# Patient Record
Sex: Female | Born: 1980 | Race: Black or African American | Hispanic: No | Marital: Single | Smoking: Current every day smoker
Health system: Southern US, Community
[De-identification: ages and names within clinical notes are randomized; demographics above are authoritative.]

## PROBLEM LIST (undated history)

## (undated) DIAGNOSIS — R42 Dizziness and giddiness: Secondary | ICD-10-CM

## (undated) HISTORY — PX: NO PAST SURGERIES: SHX2092

---
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
## 2011-06-14 ENCOUNTER — Emergency Department: Payer: Self-pay | Admitting: Unknown Physician Specialty

## 2013-08-22 ENCOUNTER — Encounter (HOSPITAL_COMMUNITY): Payer: Self-pay | Admitting: Emergency Medicine

## 2013-08-22 ENCOUNTER — Emergency Department (HOSPITAL_COMMUNITY)
Admission: EM | Admit: 2013-08-22 | Discharge: 2013-08-22 | Discharge status: Against Medical Advice | Payer: Medicaid Other | Attending: Emergency Medicine | Admitting: Emergency Medicine

## 2013-08-22 ENCOUNTER — Emergency Department (HOSPITAL_COMMUNITY)
Admission: EM | Admit: 2013-08-22 | Discharge: 2013-08-22 | Disposition: A | Discharge status: Home / Self Care | Payer: Medicaid Other | Source: Home / Self Care

## 2013-08-22 DIAGNOSIS — R300 Dysuria: Secondary | ICD-10-CM | POA: Insufficient documentation

## 2013-08-22 DIAGNOSIS — F17200 Nicotine dependence, unspecified, uncomplicated: Secondary | ICD-10-CM | POA: Insufficient documentation

## 2013-08-22 DIAGNOSIS — R109 Unspecified abdominal pain: Secondary | ICD-10-CM | POA: Insufficient documentation

## 2013-08-22 NOTE — ED Notes (Signed)
Called to room no answer x1

## 2013-08-22 NOTE — ED Notes (Signed)
Pt here for abd pain and painful urination , sts had sex Saturday night and symptoms started after that.

## 2013-08-22 NOTE — ED Notes (Signed)
Called for room x2 no answer. 

## 2013-08-22 NOTE — ED Notes (Signed)
Called to reassess vital signs - no answer.

## 2018-01-19 DIAGNOSIS — L739 Follicular disorder, unspecified: Secondary | ICD-10-CM | POA: Diagnosis not present

## 2018-02-25 DIAGNOSIS — L731 Pseudofolliculitis barbae: Secondary | ICD-10-CM | POA: Diagnosis not present

## 2018-02-25 DIAGNOSIS — R102 Pelvic and perineal pain: Secondary | ICD-10-CM | POA: Diagnosis not present

## 2018-02-25 DIAGNOSIS — R309 Painful micturition, unspecified: Secondary | ICD-10-CM | POA: Diagnosis not present

## 2018-03-10 DIAGNOSIS — L218 Other seborrheic dermatitis: Secondary | ICD-10-CM | POA: Diagnosis not present

## 2018-03-10 DIAGNOSIS — L732 Hidradenitis suppurativa: Secondary | ICD-10-CM | POA: Diagnosis not present

## 2018-03-10 DIAGNOSIS — Z79899 Other long term (current) drug therapy: Secondary | ICD-10-CM | POA: Diagnosis not present

## 2018-08-08 DIAGNOSIS — R42 Dizziness and giddiness: Secondary | ICD-10-CM

## 2018-08-08 HISTORY — DX: Dizziness and giddiness: R42

## 2018-10-11 DIAGNOSIS — J019 Acute sinusitis, unspecified: Secondary | ICD-10-CM | POA: Diagnosis not present

## 2018-10-11 DIAGNOSIS — B9689 Other specified bacterial agents as the cause of diseases classified elsewhere: Secondary | ICD-10-CM | POA: Diagnosis not present

## 2018-10-22 DIAGNOSIS — J343 Hypertrophy of nasal turbinates: Secondary | ICD-10-CM | POA: Diagnosis not present

## 2018-10-22 DIAGNOSIS — R430 Anosmia: Secondary | ICD-10-CM | POA: Diagnosis not present

## 2018-10-22 DIAGNOSIS — J3489 Other specified disorders of nose and nasal sinuses: Secondary | ICD-10-CM | POA: Diagnosis not present

## 2018-10-22 DIAGNOSIS — J342 Deviated nasal septum: Secondary | ICD-10-CM | POA: Diagnosis not present

## 2018-10-25 DIAGNOSIS — R42 Dizziness and giddiness: Secondary | ICD-10-CM | POA: Diagnosis not present

## 2018-10-25 DIAGNOSIS — Z23 Encounter for immunization: Secondary | ICD-10-CM | POA: Diagnosis not present

## 2018-10-25 DIAGNOSIS — R7303 Prediabetes: Secondary | ICD-10-CM | POA: Diagnosis not present

## 2018-10-29 DIAGNOSIS — J3489 Other specified disorders of nose and nasal sinuses: Secondary | ICD-10-CM | POA: Diagnosis not present

## 2018-11-02 DIAGNOSIS — J342 Deviated nasal septum: Secondary | ICD-10-CM | POA: Diagnosis not present

## 2018-11-02 DIAGNOSIS — J343 Hypertrophy of nasal turbinates: Secondary | ICD-10-CM | POA: Diagnosis not present

## 2018-11-02 DIAGNOSIS — J3489 Other specified disorders of nose and nasal sinuses: Secondary | ICD-10-CM | POA: Diagnosis not present

## 2018-12-31 DIAGNOSIS — R079 Chest pain, unspecified: Secondary | ICD-10-CM | POA: Diagnosis not present

## 2019-02-23 DIAGNOSIS — R3915 Urgency of urination: Secondary | ICD-10-CM | POA: Diagnosis not present

## 2019-02-23 DIAGNOSIS — R109 Unspecified abdominal pain: Secondary | ICD-10-CM | POA: Diagnosis not present

## 2019-03-29 DIAGNOSIS — J301 Allergic rhinitis due to pollen: Secondary | ICD-10-CM | POA: Diagnosis not present

## 2019-03-29 DIAGNOSIS — J342 Deviated nasal septum: Secondary | ICD-10-CM | POA: Diagnosis not present

## 2019-03-30 ENCOUNTER — Encounter: Payer: Self-pay | Admitting: *Deleted

## 2019-03-31 ENCOUNTER — Other Ambulatory Visit: Payer: Self-pay

## 2019-03-31 ENCOUNTER — Encounter: Payer: Self-pay | Admitting: *Deleted

## 2019-03-31 ENCOUNTER — Encounter: Payer: Self-pay | Admitting: Anesthesiology

## 2019-04-04 ENCOUNTER — Other Ambulatory Visit: Admission: RE | Admit: 2019-04-04 | Payer: Medicaid Other | Source: Ambulatory Visit

## 2019-04-07 NOTE — Discharge Instructions (Signed)
[facility] °[facility] °ENDOSCOPIC SINUS SURGERY °[facility] ° °What is Functional Endoscopic Sinus Surgery? ° The Surgery involves making the natural openings of the sinuses larger by removing the bony partitions that separate the sinuses from the nasal cavity.  The natural sinus lining is preserved as much as possible to allow the sinuses to resume normal function after the surgery.  In some patients nasal polyps (excessively swollen lining of the sinuses) may be removed to relieve obstruction of the sinus openings.  The surgery is performed through the nose using lighted scopes, which eliminates the need for incisions on the face.  A septoplasty is a different procedure which is sometimes performed with sinus surgery.  It involves straightening the boy partition that separates the two sides of your nose.  A crooked or deviated septum may need repair if is obstructing the sinuses or nasal airflow.  Turbinate reduction is also often performed during sinus surgery.  The turbinates are bony proturberances from the side walls of the nose which swell and can obstruct the nose in patients with sinus and allergy problems.  Their size can be surgically reduced to help relieve nasal obstruction. ° °What Can Sinus Surgery Do For Me? ° Sinus surgery can reduce the frequency of sinus infections requiring antibiotic treatment.  This can provide improvement in nasal congestion, post-nasal drainage, facial pressure and nasal obstruction.  Surgery will NOT prevent you from ever having an infection again, so it usually only for patients who get infections 4 or more times yearly requiring antibiotics, or for infections that do not clear with antibiotics.  It will not cure nasal allergies, so patients with allergies may still require medication to treat their allergies after surgery. Surgery may improve headaches related to sinusitis, however, some people will continue to  require medication to control sinus headaches related to allergies.  Surgery will do nothing for other forms of headache (migraine, tension or cluster). ° °What Are the Risks of Endoscopic Sinus Surgery? ° Current techniques allow surgery to be performed safely with little risk, however, there are rare complications that patients should be aware of.  Because the sinuses are located around the eyes, there is risk of eye injury, including blindness, though again, this would be quite rare. This is usually a result of bleeding behind the eye during surgery, which puts the vision oat risk, though there are treatments to protect the vision and prevent permanent disrupted by surgery causing a leak of the spinal fluid that surrounds the brain.  More serious complications would include bleeding inside the brain cavity or damage to the brain.  Again, all of these complications are uncommon, and spinal fluid leaks can be safely managed surgically if they occur.  The most common complication of sinus surgery is bleeding from the nose, which may require packing or cauterization of the nose.  Continued sinus have polyps may experience recurrence of the polyps requiring revision surgery.  Alterations of sense of smell or injury to the tear ducts are also rare complications.  ° °What is the Surgery Like, and what is the Recovery? ° The Surgery usually takes a couple of hours to perform, and is usually performed under a general anesthetic (completely asleep).  Patients are usually discharged home after a couple of hours.  Sometimes during surgery it is necessary to pack the nose to control bleeding, and the packing is left in place for 24 - 48 hours, and removed by your surgeon.    If a septoplasty was performed during the procedure, there is often a splint placed which must be removed after 5-7 days.   °Discomfort: Pain is usually mild to moderate, and can be controlled by prescription pain medication or acetaminophen (Tylenol).   Aspirin, Ibuprofen (Advil, Motrin), or Naprosyn (Aleve) should be avoided, as they can cause increased bleeding.  Most patients feel sinus pressure like they have a bad head cold for several days.  Sleeping with your head elevated can help reduce swelling and facial pressure, as can ice packs over the face.  A humidifier may be helpful to keep the mucous and blood from drying in the nose.  ° °Diet: There are no specific diet restrictions, however, you should generally start with clear liquids and a light diet of bland foods because the anesthetic can cause some nausea.  Advance your diet depending on how your stomach feels.  Taking your pain medication with food will often help reduce stomach upset which pain medications can cause. ° °Nasal Saline Irrigation: It is important to remove blood clots and dried mucous from the nose as it is healing.  This is done by having you irrigate the nose at least 3 - 4 times daily with a salt water solution.  We recommend using NeilMed Sinus Rinse (available at the drug store).  Fill the squeeze bottle with the solution, bend over a sink, and insert the tip of the squeeze bottle into the nose ½ of an inch.  Point the tip of the squeeze bottle towards the inside corner of the eye on the same side your irrigating.  Squeeze the bottle and gently irrigate the nose.  If you bend forward as you do this, most of the fluid will flow back out of the nose, instead of down your throat.   The solution should be warm, near body temperature, when you irrigate.   Each time you irrigate, you should use a full squeeze bottle.  ° °Note that if you are instructed to use Nasal Steroid Sprays at any time after your surgery, irrigate with saline BEFORE using the steroid spray, so you do not wash it all out of the nose. °Another product, Nasal Saline Gel (such as AYR Nasal Saline Gel) can be applied in each nostril 3 - 4 times daily to moisture the nose and reduce scabbing or crusting. ° °Bleeding:   Bloody drainage from the nose can be expected for several days, and patients are instructed to irrigate their nose frequently with salt water to help remove mucous and blood clots.  The drainage may be dark red or brown, though some fresh blood may be seen intermittently, especially after irrigation.  Do not blow you nose, as bleeding may occur. If you must sneeze, keep your mouth open to allow air to escape through your mouth. ° °If heavy bleeding occurs: Irrigate the nose with saline to rinse out clots, then spray the nose 3 - 4 times with Afrin Nasal Decongestant Spray.  The spray will constrict the blood vessels to slow bleeding.  Pinch the lower half of your nose shut to apply pressure, and lay down with your head elevated.  Ice packs over the nose may help as well. If bleeding persists despite these measures, you should notify your doctor.  Do not use the Afrin routinely to control nasal congestion after surgery, as it can result in worsening congestion and may affect healing.  ° ° ° °Activity: Return to work varies among patients. Most patients will be   out of work at least 5 - 7 days to recover.  Patient may return to work after they are off of narcotic pain medication, and feeling well enough to perform the functions of their job.  Patients must avoid heavy lifting (over 10 pounds) or strenuous physical for 2 weeks after surgery, so your employer may need to assign you to light duty, or keep you out of work longer if light duty is not possible.  NOTE: you should not drive, operate dangerous machinery, do any mentally demanding tasks or make any important legal or financial decisions while on narcotic pain medication and recovering from the general anesthetic.  °  °Call Your Doctor Immediately if You Have Any of the Following: °1. Bleeding that you cannot control with the above measures °2. Loss of vision, double vision, bulging of the eye or black eyes. °3. Fever over 101 degrees °4. Neck stiffness with  severe headache, fever, nausea and change in mental state. °You are always encourage to call anytime with concerns, however, please call with requests for pain medication refills during office hours. ° °Office Endoscopy: During follow-up visits your doctor will remove any packing or splints that may have been placed and evaluate and clean your sinuses endoscopically.  Topical anesthetic will be used to make this as comfortable as possible, though you may want to take your pain medication prior to the visit.  How often this will need to be done varies from patient to patient.  After complete recovery from the surgery, you may need follow-up endoscopy from time to time, particularly if there is concern of recurrent infection or nasal polyps. ° ° °General Anesthesia, Adult, Care After °This sheet gives you information about how to care for yourself after your procedure. Your health care provider may also give you more specific instructions. If you have problems or questions, contact your health care provider. °What can I expect after the procedure? °After the procedure, the following side effects are common: °· Pain or discomfort at the IV site. °· Nausea. °· Vomiting. °· Sore throat. °· Trouble concentrating. °· Feeling cold or chills. °· Weak or tired. °· Sleepiness and fatigue. °· Soreness and body aches. These side effects can affect parts of the body that were not involved in surgery. °Follow these instructions at home: ° °For at least 24 hours after the procedure: °· Have a responsible adult stay with you. It is important to have someone help care for you until you are awake and alert. °· Rest as needed. °· Do not: °? Participate in activities in which you could fall or become injured. °? Drive. °? Use heavy machinery. °? Drink alcohol. °? Take sleeping pills or medicines that cause drowsiness. °? Make important decisions or sign legal documents. °? Take care of children on your own. °Eating and  drinking °· Follow any instructions from your health care provider about eating or drinking restrictions. °· When you feel hungry, start by eating small amounts of foods that are soft and easy to digest (bland), such as toast. Gradually return to your regular diet. °· Drink enough fluid to keep your urine pale yellow. °· If you vomit, rehydrate by drinking water, juice, or clear broth. °General instructions °· If you have sleep apnea, surgery and certain medicines can increase your risk for breathing problems. Follow instructions from your health care provider about wearing your sleep device: °? Anytime you are sleeping, including during daytime naps. °? While taking prescription pain medicines, sleeping medicines, or medicines   that make you drowsy. °· Return to your normal activities as told by your health care provider. Ask your health care provider what activities are safe for you. °· Take over-the-counter and prescription medicines only as told by your health care provider. °· If you smoke, do not smoke without supervision. °· Keep all follow-up visits as told by your health care provider. This is important. °Contact a health care provider if: °· You have nausea or vomiting that does not get better with medicine. °· You cannot eat or drink without vomiting. °· You have pain that does not get better with medicine. °· You are unable to pass urine. °· You develop a skin rash. °· You have a fever. °· You have redness around your IV site that gets worse. °Get help right away if: °· You have difficulty breathing. °· You have chest pain. °· You have blood in your urine or stool, or you vomit blood. °Summary °· After the procedure, it is common to have a sore throat or nausea. It is also common to feel tired. °· Have a responsible adult stay with you for the first 24 hours after general anesthesia. It is important to have someone help care for you until you are awake and alert. °· When you feel hungry, start by eating  small amounts of foods that are soft and easy to digest (bland), such as toast. Gradually return to your regular diet. °· Drink enough fluid to keep your urine pale yellow. °· Return to your normal activities as told by your health care provider. Ask your health care provider what activities are safe for you. °This information is not intended to replace advice given to you by your health care provider. Make sure you discuss any questions you have with your health care provider. °Document Released: 09/01/2000 Document Revised: 05/29/2017 Document Reviewed: 01/09/2017 °[employer] Patient Education © 2020 [employer] ° °

## 2019-04-11 ENCOUNTER — Inpatient Hospital Stay: Admission: RE | Admit: 2019-04-11 | Payer: Medicaid Other | Source: Ambulatory Visit

## 2019-04-11 DIAGNOSIS — R030 Elevated blood-pressure reading, without diagnosis of hypertension: Secondary | ICD-10-CM | POA: Diagnosis not present

## 2019-04-11 DIAGNOSIS — Z0000 Encounter for general adult medical examination without abnormal findings: Secondary | ICD-10-CM | POA: Diagnosis not present

## 2019-04-11 DIAGNOSIS — Z111 Encounter for screening for respiratory tuberculosis: Secondary | ICD-10-CM | POA: Diagnosis not present

## 2019-04-11 DIAGNOSIS — Z3200 Encounter for pregnancy test, result unknown: Secondary | ICD-10-CM | POA: Diagnosis not present

## 2019-04-14 ENCOUNTER — Ambulatory Visit: Admission: RE | Admit: 2019-04-14 | Payer: Medicaid Other | Source: Home / Self Care | Admitting: Otolaryngology

## 2019-04-14 SURGERY — SEPTOPLASTY, NOSE
Anesthesia: General | Laterality: Right

## 2019-04-16 DIAGNOSIS — Z113 Encounter for screening for infections with a predominantly sexual mode of transmission: Secondary | ICD-10-CM | POA: Diagnosis not present

## 2019-04-16 DIAGNOSIS — N898 Other specified noninflammatory disorders of vagina: Secondary | ICD-10-CM | POA: Diagnosis not present

## 2019-04-16 DIAGNOSIS — R1030 Lower abdominal pain, unspecified: Secondary | ICD-10-CM | POA: Diagnosis not present

## 2019-04-16 DIAGNOSIS — R1084 Generalized abdominal pain: Secondary | ICD-10-CM | POA: Diagnosis not present

## 2019-07-25 DIAGNOSIS — R350 Frequency of micturition: Secondary | ICD-10-CM | POA: Diagnosis not present

## 2019-07-25 DIAGNOSIS — N3941 Urge incontinence: Secondary | ICD-10-CM | POA: Diagnosis not present

## 2019-08-02 DIAGNOSIS — J3489 Other specified disorders of nose and nasal sinuses: Secondary | ICD-10-CM | POA: Diagnosis not present

## 2019-08-02 DIAGNOSIS — J342 Deviated nasal septum: Secondary | ICD-10-CM | POA: Diagnosis not present

## 2019-08-02 DIAGNOSIS — J343 Hypertrophy of nasal turbinates: Secondary | ICD-10-CM | POA: Diagnosis not present

## 2019-08-05 NOTE — Discharge Instructions (Signed)
[facility] ENDOSCOPIC [facility]  What is Functional Endoscopic Sinus Surgery?  The Surgery involves making the natural openings of the sinuses larger by removing the bony partitions that separate the sinuses from the nasal cavity.  The natural sinus lining is preserved as much as possible to allow the sinuses to resume normal function after the surgery.  In some patients nasal polyps (excessively swollen lining of the sinuses) may be removed to relieve obstruction of the sinus openings.  The surgery is performed through the nose using lighted scopes, which eliminates the need for incisions on the face.  A septoplasty is a different procedure which is sometimes performed with sinus surgery.  It involves straightening the boy partition that separates the two sides of your nose.  A crooked or deviated septum may need repair if is obstructing the sinuses or nasal airflow.  Turbinate reduction is also often performed during sinus surgery.  The turbinates are bony proturberances from the side walls of the nose which swell and can obstruct the nose in patients with sinus and allergy problems.  Their size can be surgically reduced to help relieve nasal obstruction.  What Can Sinus Surgery Do For Me?  Sinus surgery can reduce the frequency of sinus infections requiring antibiotic treatment.  This can provide improvement in nasal congestion, post-nasal drainage, facial pressure and nasal obstruction.  Surgery will NOT prevent you from ever having an infection again, so it usually only for patients who get infections 4 or more times yearly requiring antibiotics, or for infections that do not clear with antibiotics.  It will not cure nasal allergies, so patients with allergies may still require medication to treat their allergies after surgery. Surgery may improve headaches related to sinusitis, however, some people will continue to  require medication to control sinus headaches related to allergies.  Surgery will do nothing for other forms of headache (migraine, tension or cluster).  What Are the Risks of Endoscopic Sinus Surgery?  Current techniques allow surgery to be performed safely with little risk, however, there are rare complications that patients should be aware of.  Because the sinuses are located around the eyes, there is risk of eye injury, including blindness, though again, this would be quite rare. This is usually a result of bleeding behind the eye during surgery, which puts the vision oat risk, though there are treatments to protect the vision and prevent permanent disrupted by surgery causing a leak of the spinal fluid that surrounds the brain.  More serious complications would include bleeding inside the brain cavity or damage to the brain.  Again, all of these complications are uncommon, and spinal fluid leaks can be safely managed surgically if they occur.  The most common complication of sinus surgery is bleeding from the nose, which may require packing or cauterization of the nose.  Continued sinus have polyps may experience recurrence of the polyps requiring revision surgery.  Alterations of sense of smell or injury to the tear ducts are also rare complications.   What is the Surgery Like, and what is the Recovery?  The Surgery usually takes a couple of hours to perform, and is usually performed under a general anesthetic (completely asleep).  Patients are usually discharged home after a couple of hours.  Sometimes during surgery it is necessary to pack the nose to control bleeding, and the packing is left in place for 24 - 48 hours, and removed by your surgeon.    If a septoplasty was performed during the procedure, there is often a splint placed which must be removed after 5-7 days.   Discomfort: Pain is usually mild to moderate, and can be controlled by prescription pain medication or acetaminophen (Tylenol).   Aspirin, Ibuprofen (Advil, Motrin), or Naprosyn (Aleve) should be avoided, as they can cause increased bleeding.  Most patients feel sinus pressure like they have a bad head cold for several days.  Sleeping with your head elevated can help reduce swelling and facial pressure, as can ice packs over the face.  A humidifier may be helpful to keep the mucous and blood from drying in the nose.   Diet: There are no specific diet restrictions, however, you should generally start with clear liquids and a light diet of bland foods because the anesthetic can cause some nausea.  Advance your diet depending on how your stomach feels.  Taking your pain medication with food will often help reduce stomach upset which pain medications can cause.  Nasal Saline Irrigation: It is important to remove blood clots and dried mucous from the nose as it is healing.  This is done by having you irrigate the nose at least 3 - 4 times daily with a salt water solution.  We recommend using NeilMed Sinus Rinse (available at the drug store).  Fill the squeeze bottle with the solution, bend over a sink, and insert the tip of the squeeze bottle into the nose  of an inch.  Point the tip of the squeeze bottle towards the inside corner of the eye on the same side your irrigating.  Squeeze the bottle and gently irrigate the nose.  If you bend forward as you do this, most of the fluid will flow back out of the nose, instead of down your throat.   The solution should be warm, near body temperature, when you irrigate.   Each time you irrigate, you should use a full squeeze bottle.   Note that if you are instructed to use Nasal Steroid Sprays at any time after your surgery, irrigate with saline BEFORE using the steroid spray, so you do not wash it all out of the nose. Another product, Nasal Saline Gel (such as AYR Nasal Saline Gel) can be applied in each nostril 3 - 4 times daily to moisture the nose and reduce scabbing or crusting.  Bleeding:   Bloody drainage from the nose can be expected for several days, and patients are instructed to irrigate their nose frequently with salt water to help remove mucous and blood clots.  The drainage may be dark red or brown, though some fresh blood may be seen intermittently, especially after irrigation.  Do not blow you nose, as bleeding may occur. If you must sneeze, keep your mouth open to allow air to escape through your mouth.  If heavy bleeding occurs: Irrigate the nose with saline to rinse out clots, then spray the nose 3 - 4 times with Afrin Nasal Decongestant Spray.  The spray will constrict the blood vessels to slow bleeding.  Pinch the lower half of your nose shut to apply pressure, and lay down with your head elevated.  Ice packs over the nose may help as well. If bleeding persists despite these measures, you should notify your doctor.  Do not use the Afrin routinely to control nasal congestion after surgery, as it can result in worsening congestion and may affect healing.     Activity: Return to work varies among patients. Most patients will be   out of work at least 5 - 7 days to recover.  Patient may return to work after they are off of narcotic pain medication, and feeling well enough to perform the functions of their job.  Patients must avoid heavy lifting (over 10 pounds) or strenuous physical for 2 weeks after surgery, so your employer may need to assign you to light duty, or keep you out of work longer if light duty is not possible.  NOTE: you should not drive, operate dangerous machinery, do any mentally demanding tasks or make any important legal or financial decisions while on narcotic pain medication and recovering from the general anesthetic.    Call Your Doctor Immediately if You Have Any of the Following: 1. Bleeding that you cannot control with the above measures 2. Loss of vision, double vision, bulging of the eye or black eyes. 3. Fever over 101 degrees 4. Neck stiffness with  severe headache, fever, nausea and change in mental state. You are always encourage to call anytime with concerns, however, please call with requests for pain medication refills during office hours.  Office Endoscopy: During follow-up visits your doctor will remove any packing or splints that may have been placed and evaluate and clean your sinuses endoscopically.  Topical anesthetic will be used to make this as comfortable as possible, though you may want to take your pain medication prior to the visit.  How often this will need to be done varies from patient to patient.  After complete recovery from the surgery, you may need follow-up endoscopy from time to time, particularly if there is concern of recurrent infection or nasal polyps.  General Anesthesia, Adult, Care After This sheet gives you information about how to care for yourself after your procedure. Your health care provider may also give you more specific instructions. If you have problems or questions, contact your health care provider. What can I expect after the procedure? After the procedure, the following side effects are common:  Pain or discomfort at the IV site.  Nausea.  Vomiting.  Sore throat.  Trouble concentrating.  Feeling cold or chills.  Weak or tired.  Sleepiness and fatigue.  Soreness and body aches. These side effects can affect parts of the body that were not involved in surgery. Follow these instructions at home:  For at least 24 hours after the procedure:  Have a responsible adult stay with you. It is important to have someone help care for you until you are awake and alert.  Rest as needed.  Do not: ? Participate in activities in which you could fall or become injured. ? Drive. ? Use heavy machinery. ? Drink alcohol. ? Take sleeping pills or medicines that cause drowsiness. ? Make important decisions or sign legal documents. ? Take care of children on your own. Eating and drinking  Follow  any instructions from your health care provider about eating or drinking restrictions.  When you feel hungry, start by eating small amounts of foods that are soft and easy to digest (bland), such as toast. Gradually return to your regular diet.  Drink enough fluid to keep your urine pale yellow.  If you vomit, rehydrate by drinking water, juice, or clear broth. General instructions  If you have sleep apnea, surgery and certain medicines can increase your risk for breathing problems. Follow instructions from your health care provider about wearing your sleep device: ? Anytime you are sleeping, including during daytime naps. ? While taking prescription pain medicines, sleeping medicines, or medicines that   make you drowsy.  Return to your normal activities as told by your health care provider. Ask your health care provider what activities are safe for you.  Take over-the-counter and prescription medicines only as told by your health care provider.  If you smoke, do not smoke without supervision.  Keep all follow-up visits as told by your health care provider. This is important. Contact a health care provider if:  You have nausea or vomiting that does not get better with medicine.  You cannot eat or drink without vomiting.  You have pain that does not get better with medicine.  You are unable to pass urine.  You develop a skin rash.  You have a fever.  You have redness around your IV site that gets worse. Get help right away if:  You have difficulty breathing.  You have chest pain.  You have blood in your urine or stool, or you vomit blood. Summary  After the procedure, it is common to have a sore throat or nausea. It is also common to feel tired.  Have a responsible adult stay with you for the first 24 hours after general anesthesia. It is important to have someone help care for you until you are awake and alert.  When you feel hungry, start by eating small amounts of  foods that are soft and easy to digest (bland), such as toast. Gradually return to your regular diet.  Drink enough fluid to keep your urine pale yellow.  Return to your normal activities as told by your health care provider. Ask your health care provider what activities are safe for you. This information is not intended to replace advice given to you by your health care provider. Make sure you discuss any questions you have with your health care provider. Document Revised: 05/29/2017 Document Reviewed: 01/09/2017 Elsevier Patient Education  2020 [employer]  

## 2019-08-08 ENCOUNTER — Encounter: Payer: Self-pay | Admitting: Anesthesiology

## 2019-08-08 ENCOUNTER — Other Ambulatory Visit: Admission: RE | Admit: 2019-08-08 | Payer: Medicaid Other | Source: Ambulatory Visit

## 2019-08-08 NOTE — Anesthesia Preprocedure Evaluation (Deleted)
Anesthesia Evaluation    Airway        Dental   Pulmonary Current Smoker,           Cardiovascular      Neuro/Psych    GI/Hepatic   Endo/Other    Renal/GU      Musculoskeletal   Abdominal   Peds  Hematology   Anesthesia Other Findings   Reproductive/Obstetrics                             Anesthesia Physical Anesthesia Plan  ASA: II  Anesthesia Plan: General   Post-op Pain Management:    Induction: Intravenous  PONV Risk Score and Plan: 3 and Treatment may vary due to age or medical condition and Ondansetron  Airway Management Planned: Oral ETT  Additional Equipment:   Intra-op Plan:   Post-operative Plan: Extubation in OR  Informed Consent: I have reviewed the patients History and Physical, chart, labs and discussed the procedure including the risks, benefits and alternatives for the proposed anesthesia with the patient or authorized representative who has indicated his/her understanding and acceptance.       Plan Discussed with: CRNA and Anesthesiologist  Anesthesia Plan Comments:         Anesthesia Quick Evaluation

## 2019-08-10 ENCOUNTER — Encounter: Admission: RE | Payer: Self-pay | Source: Home / Self Care

## 2019-08-10 ENCOUNTER — Ambulatory Visit: Admission: RE | Admit: 2019-08-10 | Payer: Medicaid Other | Source: Home / Self Care | Admitting: Otolaryngology

## 2019-08-10 SURGERY — SEPTOPLASTY, NOSE
Anesthesia: General | Laterality: Right

## 2019-08-23 DIAGNOSIS — J342 Deviated nasal septum: Secondary | ICD-10-CM | POA: Diagnosis not present

## 2019-08-23 DIAGNOSIS — J343 Hypertrophy of nasal turbinates: Secondary | ICD-10-CM | POA: Diagnosis not present

## 2019-08-24 ENCOUNTER — Other Ambulatory Visit: Payer: Self-pay

## 2019-08-30 ENCOUNTER — Other Ambulatory Visit
Admission: RE | Admit: 2019-08-30 | Discharge: 2019-08-30 | Disposition: A | Discharge status: Home / Self Care | Payer: Medicaid Other | Source: Ambulatory Visit | Attending: Otolaryngology | Admitting: Otolaryngology

## 2019-08-30 ENCOUNTER — Other Ambulatory Visit: Payer: Self-pay

## 2019-08-30 DIAGNOSIS — Z01812 Encounter for preprocedural laboratory examination: Secondary | ICD-10-CM | POA: Diagnosis not present

## 2019-08-30 DIAGNOSIS — Z20822 Contact with and (suspected) exposure to covid-19: Secondary | ICD-10-CM | POA: Insufficient documentation

## 2019-08-30 LAB — SARS CORONAVIRUS 2 (TAT 6-24 HRS): SARS Coronavirus 2: NEGATIVE

## 2019-09-01 ENCOUNTER — Ambulatory Visit: Payer: Medicaid Other | Admitting: Anesthesiology

## 2019-09-01 ENCOUNTER — Encounter: Payer: Self-pay | Admitting: Otolaryngology

## 2019-09-01 ENCOUNTER — Encounter
Admission: RE | Disposition: A | Discharge status: Home / Self Care | Payer: Self-pay | Source: Home / Self Care | Attending: Otolaryngology

## 2019-09-01 ENCOUNTER — Other Ambulatory Visit: Payer: Self-pay

## 2019-09-01 ENCOUNTER — Ambulatory Visit
Admission: RE | Admit: 2019-09-01 | Discharge: 2019-09-01 | Disposition: A | Discharge status: Home / Self Care | Payer: Medicaid Other | Attending: Otolaryngology | Admitting: Otolaryngology

## 2019-09-01 DIAGNOSIS — J342 Deviated nasal septum: Secondary | ICD-10-CM | POA: Diagnosis not present

## 2019-09-01 DIAGNOSIS — J343 Hypertrophy of nasal turbinates: Secondary | ICD-10-CM | POA: Insufficient documentation

## 2019-09-01 DIAGNOSIS — J3489 Other specified disorders of nose and nasal sinuses: Secondary | ICD-10-CM | POA: Diagnosis not present

## 2019-09-01 HISTORY — PX: TURBINATE REDUCTION: SHX6157

## 2019-09-01 HISTORY — PX: ENDOSCOPIC CONCHA BULLOSA RESECTION: SHX6395

## 2019-09-01 HISTORY — PX: SEPTOPLASTY: SHX2393

## 2019-09-01 LAB — POCT PREGNANCY, URINE: Preg Test, Ur: NEGATIVE

## 2019-09-01 SURGERY — SEPTOPLASTY, NOSE
Anesthesia: General | Site: Nose | Laterality: Right

## 2019-09-01 MED ORDER — LIDOCAINE HCL (CARDIAC) PF 100 MG/5ML IV SOSY
PREFILLED_SYRINGE | INTRAVENOUS | Status: DC | PRN
  Administered 2019-09-01: 40 mg via INTRAVENOUS

## 2019-09-01 MED ORDER — DEXAMETHASONE SODIUM PHOSPHATE 4 MG/ML IJ SOLN
INTRAMUSCULAR | Status: DC | PRN
  Administered 2019-09-01: 10 mg via INTRAVENOUS

## 2019-09-01 MED ORDER — ACETAMINOPHEN 10 MG/ML IV SOLN
1000.0000 mg | Freq: Once | INTRAVENOUS | Status: AC
  Administered 2019-09-01: 1000 mg via INTRAVENOUS

## 2019-09-01 MED ORDER — PHENYLEPHRINE HCL 0.5 % NA SOLN
NASAL | Status: DC | PRN
  Administered 2019-09-01: 15 mL via TOPICAL

## 2019-09-01 MED ORDER — ONDANSETRON HCL 4 MG/2ML IJ SOLN
INTRAMUSCULAR | Status: DC | PRN
  Administered 2019-09-01: 4 mg via INTRAVENOUS

## 2019-09-01 MED ORDER — MIDAZOLAM HCL 5 MG/5ML IJ SOLN
INTRAMUSCULAR | Status: DC | PRN
  Administered 2019-09-01: 2 mg via INTRAVENOUS

## 2019-09-01 MED ORDER — PROPOFOL 10 MG/ML IV BOLUS
INTRAVENOUS | Status: DC | PRN
  Administered 2019-09-01: 140 mg via INTRAVENOUS

## 2019-09-01 MED ORDER — LACTATED RINGERS IV SOLN
10.0000 mL/h | INTRAVENOUS | Status: DC
  Administered 2019-09-01: 10 mL/h via INTRAVENOUS

## 2019-09-01 MED ORDER — DEXTROSE 5 % IV SOLN
2000.0000 mg | Freq: Once | INTRAVENOUS | Status: AC
  Administered 2019-09-01: 09:00:00 2000 mg via INTRAVENOUS

## 2019-09-01 MED ORDER — FENTANYL CITRATE (PF) 100 MCG/2ML IJ SOLN
INTRAMUSCULAR | Status: DC | PRN
  Administered 2019-09-01 (×3): 25 ug via INTRAVENOUS
  Administered 2019-09-01 (×2): 50 ug via INTRAVENOUS
  Administered 2019-09-01: 25 ug via INTRAVENOUS

## 2019-09-01 MED ORDER — SCOPOLAMINE 1 MG/3DAYS TD PT72
1.0000 | MEDICATED_PATCH | Freq: Once | TRANSDERMAL | Status: DC
  Administered 2019-09-01: 08:00:00 1.5 mg via TRANSDERMAL

## 2019-09-01 MED ORDER — LIDOCAINE-EPINEPHRINE 1 %-1:100000 IJ SOLN
INTRAMUSCULAR | Status: DC | PRN
  Administered 2019-09-01: 6 mL

## 2019-09-01 MED ORDER — GLYCOPYRROLATE 0.2 MG/ML IJ SOLN
INTRAMUSCULAR | Status: DC | PRN
  Administered 2019-09-01: .1 mg via INTRAVENOUS

## 2019-09-01 MED ORDER — OXYCODONE HCL 5 MG PO TABS: 5.0000 mg | ORAL_TABLET | Freq: Once | ORAL | Status: AC | PRN

## 2019-09-01 MED ORDER — PREDNISONE 10 MG PO TABS: ORAL_TABLET | ORAL | 0 refills | Status: AC

## 2019-09-01 MED ORDER — LIDOCAINE HCL 4 % MT SOLN
OROMUCOSAL | Status: DC | PRN
  Administered 2019-09-01: 4 mL via TOPICAL

## 2019-09-01 MED ORDER — OXYCODONE HCL 5 MG/5ML PO SOLN
5.0000 mg | Freq: Once | ORAL | Status: AC | PRN
  Administered 2019-09-01: 11:00:00 5 mg via ORAL

## 2019-09-01 MED ORDER — SUCCINYLCHOLINE CHLORIDE 20 MG/ML IJ SOLN
INTRAMUSCULAR | Status: DC | PRN
  Administered 2019-09-01: 80 mg via INTRAVENOUS

## 2019-09-01 MED ORDER — CEPHALEXIN 500 MG PO CAPS: 500.0000 mg | ORAL_CAPSULE | Freq: Two times a day (BID) | ORAL | 0 refills | Status: AC

## 2019-09-01 MED ORDER — ONDANSETRON HCL 4 MG/2ML IJ SOLN: 4.0000 mg | Freq: Once | INTRAMUSCULAR | Status: DC | PRN

## 2019-09-01 MED ORDER — HYDROCODONE-ACETAMINOPHEN 5-325 MG PO TABS: 1.0000 | ORAL_TABLET | Freq: Four times a day (QID) | ORAL | 0 refills | Status: AC | PRN

## 2019-09-01 MED ORDER — FENTANYL CITRATE (PF) 100 MCG/2ML IJ SOLN: 25.0000 ug | INTRAMUSCULAR | Status: DC | PRN

## 2019-09-01 MED ORDER — OXYMETAZOLINE HCL 0.05 % NA SOLN
2.0000 | Freq: Once | NASAL | Status: AC
  Administered 2019-09-01: 08:00:00 2 via NASAL

## 2019-09-01 SURGICAL SUPPLY — 29 items
CANISTER SUCT 1200ML W/VALVE (MISCELLANEOUS) ×3 IMPLANT
CATH IV 18X1 1/4 SAFELET (CATHETERS) ×3 IMPLANT
COAGULATOR SUCT 8FR VV (MISCELLANEOUS) ×3 IMPLANT
DRAPE HEAD BAR (DRAPES) ×3 IMPLANT
ELECT REM PT RETURN 9FT ADLT (ELECTROSURGICAL) ×3
ELECTRODE REM PT RTRN 9FT ADLT (ELECTROSURGICAL) ×2 IMPLANT
GLOVE PI ULTRA LF STRL 7.5 (GLOVE) ×4 IMPLANT
GLOVE PI ULTRA NON LATEX 7.5 (GLOVE) ×2
GOWN STRL REUS W/ TWL LRG LVL3 (GOWN DISPOSABLE) ×2 IMPLANT
GOWN STRL REUS W/TWL LRG LVL3 (GOWN DISPOSABLE) ×1
IV CATH 18X1 1/4 SAFELET (CATHETERS) ×2
KIT TURNOVER KIT A (KITS) ×3 IMPLANT
NDL HYPO 27GX1-1/4 (NEEDLE) ×2 IMPLANT
NEEDLE HYPO 27GX1-1/4 (NEEDLE) ×3 IMPLANT
PACK ENT CUSTOM (PACKS) ×3 IMPLANT
PACKING NASAL EPIS 4X2.4 XEROG (MISCELLANEOUS) ×1 IMPLANT
PATTIES SURGICAL .5 X3 (DISPOSABLE) ×3 IMPLANT
SOL ANTI-FOG 6CC FOG-OUT (MISCELLANEOUS) ×2 IMPLANT
SOL FOG-OUT ANTI-FOG 6CC (MISCELLANEOUS) ×1
SPLINT NASAL SEPTAL BLV .50 ST (MISCELLANEOUS) ×3 IMPLANT
STRAP BODY AND KNEE 60X3 (MISCELLANEOUS) ×6 IMPLANT
SUT CHROMIC 3-0 (SUTURE) ×1
SUT CHROMIC 3-0 KS 27XMFL CR (SUTURE) ×2
SUT ETHILON 3-0 KS 30 BLK (SUTURE) ×3 IMPLANT
SUT PLAIN GUT 4-0 (SUTURE) ×3 IMPLANT
SUTURE CHRMC 3-0 KS 27XMFL CR (SUTURE) IMPLANT
SYR 3ML LL SCALE MARK (SYRINGE) ×3 IMPLANT
TOWEL OR 17X26 4PK STRL BLUE (TOWEL DISPOSABLE) ×3 IMPLANT
WATER STERILE IRR 250ML POUR (IV SOLUTION) ×3 IMPLANT

## 2019-09-01 NOTE — Anesthesia Preprocedure Evaluation (Signed)
Anesthesia Evaluation  Patient identified by MRN, date of birth, ID band Patient awake    Reviewed: Allergy & Precautions, H&P , NPO status , Patient's Chart, lab work & pertinent test results  Airway Mallampati: II  TM Distance: >3 FB Neck ROM: full    Dental no notable dental hx.    Pulmonary Current Smoker and Patient abstained from smoking.,    Pulmonary exam normal breath sounds clear to auscultation       Cardiovascular Normal cardiovascular exam Rhythm:regular Rate:Normal     Neuro/Psych    GI/Hepatic   Endo/Other    Renal/GU      Musculoskeletal   Abdominal   Peds  Hematology   Anesthesia Other Findings   Reproductive/Obstetrics                             Anesthesia Physical Anesthesia Plan  ASA: II  Anesthesia Plan: General ETT   Post-op Pain Management:    Induction:   PONV Risk Score and Plan: 3 and Treatment may vary due to age or medical condition, Ondansetron, Dexamethasone and Scopolamine patch - Pre-op  Airway Management Planned:   Additional Equipment:   Intra-op Plan:   Post-operative Plan:   Informed Consent: I have reviewed the patients History and Physical, chart, labs and discussed the procedure including the risks, benefits and alternatives for the proposed anesthesia with the patient or authorized representative who has indicated his/her understanding and acceptance.     Dental Advisory Given  Plan Discussed with: CRNA  Anesthesia Plan Comments:         Anesthesia Quick Evaluation

## 2019-09-01 NOTE — H&P (Signed)
H&P has been reviewed and patient reevaluated, no changes necessary. To be downloaded later.  

## 2019-09-01 NOTE — Op Note (Signed)
09/01/2019  10:44 AM  [id number]   Pre-Op Dx:  Deviated Nasal Septum, Hypertrophic Inferior Turbinates, concha bullosa right middle turbinate  Post-op Dx: Same  Proc: Nasal Septoplasty, Bilateral Partial Reduction Inferior Turbinates, endoscopic trimming of concha bullosa of right middle turbinate  Surg:  Beverly Sessions Anakin Varkey  Anes:  GOT  EBL: 50 mL  Comp: None  Findings: Septum deviated towards the left side with a spur there.  The ethmoid plate with some to left as well.  The left middle turbinate was lateralized.  The right middle turbinate was overgrown with a large concha bullosa that was mostly mucus filled on the CT scan.  The inferior turbinates were enlarged bilaterally.  Procedure: With the patient in a comfortable supine position,  general orotracheal anesthesia was induced without difficulty.     The patient received preoperative Afrin spray for topical decongestion and vasoconstriction.  Intravenous prophylactic antibiotics were administered.  At an appropriate level, the patient was placed in a semi-sitting position.  Nasal vibrissae were trimmed.   1% Xylocaine with 1:100,000 epinephrine, 6 cc's, was infiltrated into the anterior floor of the nose, into the nasal spine region, into the membranous columella, and finally into the submucoperichondrial plane of the septum on both sides.  Several minutes were allowed for this to take effect.  Cottoniod pledgetts soaked in Afrin and 4% Xylocaine were placed into both nasal cavities and left while the patient was prepped and draped in the standard fashion.  The materials were removed from the nose and observed to be intact and correct in number.  The nose was inspected with a headlight and zero degree scope with the findings as described above.  A left Killian incision was sharply executed and carried down to the quadrangular cartilage. The mucoperichondrium was elelvated along the quadrangular plate back to the bony-cartilaginous  junction. The mucoperiostium was then elevated along the ethmoid plate and the vomer. The boney-catilaginous junction was then split with a freer elevator and the mucoperiosteum was elevated on the opposite side. The mucoperiosteum was then elevated along the maxillary crest as needed to expose the crooked bone of the crest.  Boney spurs of the vomer and maxillary crest were removed with Lenoria Chime forceps.  The cartilaginous plate was trimmed along its posterior and inferior borders of about 2 mm of cartilage to free it up inferiorly. Some of the deviated ethmoid plate was then fractured and removed with Takahashi forceps to free up the posterior border of the quadrangular plate and allow it to swing back to the midline. The mucosal flaps were placed back into their anatomic position to allow visualization of the airways. The septum now sat in the midline with an improved airway.  A 3-0 Chromic suture on a Keith needle in used to anchor the inferior septum at the nasal spine with a through and through suture. The mucosal flaps are then sutured together using a through and through whip stitch of 4-0 Plain Gut with a mini-Keith needle. This was used to close the [place] incision as well.   The inferior turbinates were then inspected. An incision was created along the inferior aspect of the left inferior turbinate with removal of some of the inferior soft tissue and bone. Electrocautery was used to control bleeding in the area. The remaining turbinate was then outfractured to open up the airway further. There was no significant bleeding noted. The right turbinate was then trimmed and outfractured in a similar fashion.  The 0 degree scope was used to  visualize the airways both sides.  The right middle turbinate was overgrown.  A Hartmann forcep was used for trimming some of the anterior border of the middle turbinate and a large concha bullosa was found.  The entire lateral wall of the concha bullosa was removed  to leave a J shaped middle turbinate with the medial wall and part of the bottom.  Through biting forceps were used to trim this around the edges to create smooth edges.  Mucosa was left on the inside portion of the concha bullosa.  Electrocautery was used along the anterior and inferior trim borders to control bleeding.  Xerogel was then placed over this and filling the middle meatus, and covering the cut edges of the middle turbinate.  The 0 degree scope was used to visualize the left side and the middle turbinate was infractured some.  Some xerogel was placed the middle meatus to help hold that more medialized.  The airways were then visualized and showed open passageways on both sides that were significantly improved compared to before surgery. There was no signifcant bleeding. Nasal splints were applied to both sides of the septum using Xomed 0.58mm regular sized splints that were trimmed, and then held in position with a 3-0 Nylon through and through suture.  The patient was turned back over to anesthesia, and awakened, extubated, and taken to the PACU in satisfactory condition.  Dispo:   PACU to home  Plan: Ice, elevation, narcotic analgesia, steroid taper, and prophylactic antibiotics for the duration of indwelling nasal foreign bodies.  We will reevaluate the patient in the office in 6 days and remove the septal splints.  Return to work in 10 days, strenuous activities in two weeks.   Elon Alas Deborha Moseley 09/01/2019 10:44 AM

## 2019-09-01 NOTE — Anesthesia Procedure Notes (Signed)
Procedure Name: Intubation Date/Time: 09/01/2019 9:35 AM Performed by: Jimmy Picket, CRNA Pre-anesthesia Checklist: Patient identified, Emergency Drugs available, Suction available, Patient being monitored and Timeout performed Patient Re-evaluated:Patient Re-evaluated prior to induction Oxygen Delivery Method: Circle system utilized Preoxygenation: Pre-oxygenation with 100% oxygen Induction Type: IV induction Ventilation: Mask ventilation without difficulty Laryngoscope Size: Miller and 2 Grade View: Grade I Tube type: Oral Rae Tube size: 7.0 mm Number of attempts: 1 Placement Confirmation: ETT inserted through vocal cords under direct vision,  positive ETCO2 and breath sounds checked- equal and bilateral Tube secured with: Tape Dental Injury: Teeth and Oropharynx as per pre-operative assessment

## 2019-09-01 NOTE — Anesthesia Postprocedure Evaluation (Signed)
Anesthesia Post Note  Patient: Terri Dawson  Procedure(s) Performed: SEPTOPLASTY (Bilateral Nose) TURBINATE REDUCTION (Bilateral Nose) ENDOSCOPIC CONCHA BULLOSA RESECTION (Right Nose)     Patient location during evaluation: PACU Anesthesia Type: General Level of consciousness: awake and alert and oriented Pain management: satisfactory to patient Vital Signs Assessment: post-procedure vital signs reviewed and stable Respiratory status: spontaneous breathing, nonlabored ventilation and respiratory function stable Cardiovascular status: blood pressure returned to baseline and stable Postop Assessment: Adequate PO intake and No signs of nausea or vomiting Anesthetic complications: no    Cherly Beach

## 2019-09-01 NOTE — Transfer of Care (Signed)
Immediate Anesthesia Transfer of Care Note  Patient: Terri Dawson  Procedure(s) Performed: SEPTOPLASTY (Bilateral Nose) TURBINATE REDUCTION (Bilateral Nose) ENDOSCOPIC CONCHA BULLOSA RESECTION (Right Nose)  Patient Location: PACU  Anesthesia Type: General ETT  Level of Consciousness: awake, alert  and patient cooperative  Airway and Oxygen Therapy: Patient Spontanous Breathing and Patient connected to supplemental oxygen  Post-op Assessment: Post-op Vital signs reviewed, Patient's Cardiovascular Status Stable, Respiratory Function Stable, Patent Airway and No signs of Nausea or vomiting  Post-op Vital Signs: Reviewed and stable  Complications: No apparent anesthesia complications

## 2019-09-01 NOTE — Discharge Instructions (Signed)
[facility] ENDOSCOPIC SINUS SURGERY [facility]  What is Functional Endoscopic Sinus Surgery?  The Surgery involves making the natural openings of the sinuses larger by removing the bony partitions that separate the sinuses from the nasal cavity.  The natural sinus lining is preserved as much as possible to allow the sinuses to resume normal function after the surgery.  In some patients nasal polyps (excessively swollen lining of the sinuses) may be removed to relieve obstruction of the sinus openings.  The surgery is performed through the nose using lighted scopes, which eliminates the need for incisions on the face.  A septoplasty is a different procedure which is sometimes performed with sinus surgery.  It involves straightening the boy partition that separates the two sides of your nose.  A crooked or deviated septum may need repair if is obstructing the sinuses or nasal airflow.  Turbinate reduction is also often performed during sinus surgery.  The turbinates are bony proturberances from the side walls of the nose which swell and can obstruct the nose in patients with sinus and allergy problems.  Their size can be surgically reduced to help relieve nasal obstruction.  What Can Sinus Surgery Do For Me?  Sinus surgery can reduce the frequency of sinus infections requiring antibiotic treatment.  This can provide improvement in nasal congestion, post-nasal drainage, facial pressure and nasal obstruction.  Surgery will NOT prevent you from ever having an infection again, so it usually only for patients who get infections 4 or more times yearly requiring antibiotics, or for infections that do not clear with antibiotics.  It will not cure nasal allergies, so patients with allergies may still require medication to treat their allergies after surgery. Surgery may improve headaches related to sinusitis, however, some people will continue to  require medication to control sinus headaches related to allergies.  Surgery will do nothing for other forms of headache (migraine, tension or cluster).  What Are the Risks of Endoscopic Sinus Surgery?  Current techniques allow surgery to be performed safely with little risk, however, there are rare complications that patients should be aware of.  Because the sinuses are located around the eyes, there is risk of eye injury, including blindness, though again, this would be quite rare. This is usually a result of bleeding behind the eye during surgery, which puts the vision oat risk, though there are treatments to protect the vision and prevent permanent disrupted by surgery causing a leak of the spinal fluid that surrounds the brain.  More serious complications would include bleeding inside the brain cavity or damage to the brain.  Again, all of these complications are uncommon, and spinal fluid leaks can be safely managed surgically if they occur.  The most common complication of sinus surgery is bleeding from the nose, which may require packing or cauterization of the nose.  Continued sinus have polyps may experience recurrence of the polyps requiring revision surgery.  Alterations of sense of smell or injury to the tear ducts are also rare complications.   What is the Surgery Like, and what is the Recovery?  The Surgery usually takes a couple of hours to perform, and is usually performed under a general anesthetic (completely asleep).  Patients are usually discharged home after a couple of hours.  Sometimes during surgery it is necessary to pack the nose to control bleeding, and the packing is left in place for 24 - 48 hours, and removed by your surgeon.    If a septoplasty was performed during the procedure, there is often a splint placed which must be removed after 5-7 days.   Discomfort: Pain is usually mild to moderate, and can be controlled by prescription pain medication or acetaminophen (Tylenol).   Aspirin, Ibuprofen (Advil, Motrin), or Naprosyn (Aleve) should be avoided, as they can cause increased bleeding.  Most patients feel sinus pressure like they have a bad head cold for several days.  Sleeping with your head elevated can help reduce swelling and facial pressure, as can ice packs over the face.  A humidifier may be helpful to keep the mucous and blood from drying in the nose.   Diet: There are no specific diet restrictions, however, you should generally start with clear liquids and a light diet of bland foods because the anesthetic can cause some nausea.  Advance your diet depending on how your stomach feels.  Taking your pain medication with food will often help reduce stomach upset which pain medications can cause.  Nasal Saline Irrigation: It is important to remove blood clots and dried mucous from the nose as it is healing.  This is done by having you irrigate the nose at least 3 - 4 times daily with a salt water solution.  We recommend using NeilMed Sinus Rinse (available at the drug store).  Fill the squeeze bottle with the solution, bend over a sink, and insert the tip of the squeeze bottle into the nose  of an inch.  Point the tip of the squeeze bottle towards the inside corner of the eye on the same side your irrigating.  Squeeze the bottle and gently irrigate the nose.  If you bend forward as you do this, most of the fluid will flow back out of the nose, instead of down your throat.   The solution should be warm, near body temperature, when you irrigate.   Each time you irrigate, you should use a full squeeze bottle.   Note that if you are instructed to use Nasal Steroid Sprays at any time after your surgery, irrigate with saline BEFORE using the steroid spray, so you do not wash it all out of the nose. Another product, Nasal Saline Gel (such as AYR Nasal Saline Gel) can be applied in each nostril 3 - 4 times daily to moisture the nose and reduce scabbing or crusting.  Bleeding:   Bloody drainage from the nose can be expected for several days, and patients are instructed to irrigate their nose frequently with salt water to help remove mucous and blood clots.  The drainage may be dark red or brown, though some fresh blood may be seen intermittently, especially after irrigation.  Do not blow you nose, as bleeding may occur. If you must sneeze, keep your mouth open to allow air to escape through your mouth.  If heavy bleeding occurs: Irrigate the nose with saline to rinse out clots, then spray the nose 3 - 4 times with Afrin Nasal Decongestant Spray.  The spray will constrict the blood vessels to slow bleeding.  Pinch the lower half of your nose shut to apply pressure, and lay down with your head elevated.  Ice packs over the nose may help as well. If bleeding persists despite these measures, you should notify your doctor.  Do not use the Afrin routinely to control nasal congestion after surgery, as it can result in worsening congestion and may affect healing.   Activity: Return to work varies among patients. Most patients will be out of   work at least 5 - 7 days to recover.  Patient may return to work after they are off of narcotic pain medication, and feeling well enough to perform the functions of their job.  Patients must avoid heavy lifting (over 10 pounds) or strenuous physical for 2 weeks after surgery, so your employer may need to assign you to light duty, or keep you out of work longer if light duty is not possible.  NOTE: you should not drive, operate dangerous machinery, do any mentally demanding tasks or make any important legal or financial decisions while on narcotic pain medication and recovering from the general anesthetic.    Call Your Doctor Immediately if You Have Any of the Following: 1. Bleeding that you cannot control with the above measures 2. Loss of vision, double vision, bulging of the eye or black eyes. 3. Fever over 101 degrees 4. Neck stiffness with severe  headache, fever, nausea and change in mental state. You are always encourage to call anytime with concerns, however, please call with requests for pain medication refills during office hours.  Office Endoscopy: During follow-up visits your doctor will remove any packing or splints that may have been placed and evaluate and clean your sinuses endoscopically.  Topical anesthetic will be used to make this as comfortable as possible, though you may want to take your pain medication prior to the visit.  How often this will need to be done varies from patient to patient.  After complete recovery from the surgery, you may need follow-up endoscopy from time to time, particularly if there is concern of recurrent infection or nasal polyps.  General Anesthesia, Adult, Care After This sheet gives you information about how to care for yourself after your procedure. Your health care provider may also give you more specific instructions. If you have problems or questions, contact your health care provider. What can I expect after the procedure? After the procedure, the following side effects are common:  Pain or discomfort at the IV site.  Nausea.  Vomiting.  Sore throat.  Trouble concentrating.  Feeling cold or chills.  Weak or tired.  Sleepiness and fatigue.  Soreness and body aches. These side effects can affect parts of the body that were not involved in surgery. Follow these instructions at home:  For at least 24 hours after the procedure:  Have a responsible adult stay with you. It is important to have someone help care for you until you are awake and alert.  Rest as needed.  Do not: ? Participate in activities in which you could fall or become injured. ? Drive. ? Use heavy machinery. ? Drink alcohol. ? Take sleeping pills or medicines that cause drowsiness. ? Make important decisions or sign legal documents. ? Take care of children on your own. Eating and drinking  Follow any  instructions from your health care provider about eating or drinking restrictions.  When you feel hungry, start by eating small amounts of foods that are soft and easy to digest (bland), such as toast. Gradually return to your regular diet.  Drink enough fluid to keep your urine pale yellow.  If you vomit, rehydrate by drinking water, juice, or clear broth. General instructions  If you have sleep apnea, surgery and certain medicines can increase your risk for breathing problems. Follow instructions from your health care provider about wearing your sleep device: ? Anytime you are sleeping, including during daytime naps. ? While taking prescription pain medicines, sleeping medicines, or medicines that make you   drowsy.  Return to your normal activities as told by your health care provider. Ask your health care provider what activities are safe for you.  Take over-the-counter and prescription medicines only as told by your health care provider.  If you smoke, do not smoke without supervision.  Keep all follow-up visits as told by your health care provider. This is important. Contact a health care provider if:  You have nausea or vomiting that does not get better with medicine.  You cannot eat or drink without vomiting.  You have pain that does not get better with medicine.  You are unable to pass urine.  You develop a skin rash.  You have a fever.  You have redness around your IV site that gets worse. Get help right away if:  You have difficulty breathing.  You have chest pain.  You have blood in your urine or stool, or you vomit blood. Summary  After the procedure, it is common to have a sore throat or nausea. It is also common to feel tired.  Have a responsible adult stay with you for the first 24 hours after general anesthesia. It is important to have someone help care for you until you are awake and alert.  When you feel hungry, start by eating small amounts of foods  that are soft and easy to digest (bland), such as toast. Gradually return to your regular diet.  Drink enough fluid to keep your urine pale yellow.  Return to your normal activities as told by your health care provider. Ask your health care provider what activities are safe for you. This information is not intended to replace advice given to you by your health care provider. Make sure you discuss any questions you have with your health care provider. Document Revised: 05/29/2017 Document Reviewed: 01/09/2017 Elsevier Patient Education  2020 Else[employer] Inc.  Scopolamine skin patches Remove in 72 hrs. Wash hands immediately after removal. What is this medicine? SCOPOLAMINE (skoe POL a meen) is used to prevent nausea and vomiting caused by motion sickness, anesthesia and surgery. This medicine may be used for other purposes; ask your health care provider or pharmacist if you have questions. COMMON BRAND NAME(S): Transderm Scop What should I tell my health care provider before I take this medicine? They need to know if you have any of these conditions:  are scheduled to have a gastric secretion test  glaucoma  heart disease  kidney disease  liver disease  lung or breathing disease, like asthma  mental illness  prostate disease  seizures  stomach or intestine problems  trouble passing urine  an unusual or allergic reaction to scopolamine, atropine, other medicines, foods, dyes, or preservatives  pregnant or trying to get pregnant  breast-feeding How should I use this medicine? This medicine is for external use only. Follow the directions on the prescription label. Wear only 1 patch at a time. Choose an area behind the ear, that is clean, dry, hairless and free from any cuts or irritation. Wipe the area with a clean dry tissue. Peel off the plastic backing of the skin patch, trying not to touch the adhesive side with your hands. Do not cut the patches. Firmly apply to the area  you have chosen, with the metallic side of the patch to the skin and the tan-colored side showing. Once firmly in place, wash your hands well with soap and water. Do not get this medicine into your eyes. After removing the patch, wash your hands and the   area behind your ear thoroughly with soap and water. The patch will still contain some medicine after use. To avoid accidental contact or ingestion by children or pets, fold the used patch in half with the sticky side together and throw away in the trash out of the reach of children and pets. If you need to use a second patch after you remove the first, place it behind the other ear. A special MedGuide will be given to you by the pharmacist with each prescription and refill. Be sure to read this information carefully each time. Talk to your pediatrician regarding the use of this medicine in children. Special care may be needed. Overdosage: If you think you have taken too much of this medicine contact a poison control center or emergency room at once. NOTE: This medicine is only for you. Do not share this medicine with others. What if I miss a dose? This does not apply. This medicine is not for regular use. What may interact with this medicine?  alcohol  antihistamines for allergy cough and cold  atropine  certain medicines for anxiety or sleep  certain medicines for bladder problems like oxybutynin, tolterodine  certain medicines for depression like amitriptyline, fluoxetine, sertraline  certain medicines for stomach problems like dicyclomine, hyoscyamine  certain medicines for Parkinson's disease like benztropine, trihexyphenidyl  certain medicines for seizures like phenobarbital, primidone  general anesthetics like halothane, isoflurane, methoxyflurane, propofol  ipratropium  local anesthetics like lidocaine, pramoxine, tetracaine  medicines that relax muscles for surgery  phenothiazines like chlorpromazine, mesoridazine,  prochlorperazine, thioridazine  narcotic medicines for pain  other belladonna alkaloids This list may not describe all possible interactions. Give your health care provider a list of all the medicines, herbs, non-prescription drugs, or dietary supplements you use. Also tell them if you smoke, drink alcohol, or use illegal drugs. Some items may interact with your medicine. What should I watch for while using this medicine? Limit contact with water while swimming and bathing because the patch may fall off. If the patch falls off, throw it away and put a new one behind the other ear. You may get drowsy or dizzy. Do not drive, use machinery, or do anything that needs mental alertness until you know how this medicine affects you. Do not stand or sit up quickly, especially if you are an older patient. This reduces the risk of dizzy or fainting spells. Alcohol may interfere with the effect of this medicine. Avoid alcoholic drinks. Your mouth may get dry. Chewing sugarless gum or sucking hard candy, and drinking plenty of water may help. Contact your healthcare professional if the problem does not go away or is severe. This medicine may cause dry eyes and blurred vision. If you wear contact lenses, you may feel some discomfort. Lubricating drops may help. See your healthcare professional if the problem does not go away or is severe. If you are going to need surgery, an MRI, CT scan, or other procedure, tell your healthcare professional that you are using this medicine. You may need to remove the patch before the procedure. What side effects may I notice from receiving this medicine? Side effects that you should report to your doctor or health care professional as soon as possible:  allergic reactions like skin rash, itching or hives; swelling of the face, lips, or tongue  blurred vision  changes in vision  confusion  dizziness  eye pain  fast, irregular heartbeat  hallucinations, loss of contact  with reality  nausea, vomiting    pain or trouble passing urine  restlessness  seizures  skin irritation  stomach pain Side effects that usually do not require medical attention (report to your doctor or health care professional if they continue or are bothersome):  drowsiness  dry mouth  headache  sore throat This list may not describe all possible side effects. Call your doctor for medical advice about side effects. You may report side effects to FDA at 1-800-FDA-1088. Where should I keep my medicine? Keep out of the reach of children. Store at room temperature between 20 and 25 degrees C (68 and 77 degrees F). Keep this medicine in the foil package until ready to use. Throw away any unused medicine after the expiration date. NOTE: This sheet is a summary. It may not cover all possible information. If you have questions about this medicine, talk to your doctor, pharmacist, or health care provider.  2020 Elsevier/Gold Standard (2017-08-14 16:14:46)   

## 2019-09-06 DIAGNOSIS — Z124 Encounter for screening for malignant neoplasm of cervix: Secondary | ICD-10-CM | POA: Diagnosis not present

## 2019-09-06 DIAGNOSIS — R030 Elevated blood-pressure reading, without diagnosis of hypertension: Secondary | ICD-10-CM | POA: Diagnosis not present

## 2019-09-06 DIAGNOSIS — N3941 Urge incontinence: Secondary | ICD-10-CM | POA: Diagnosis not present

## 2019-09-06 DIAGNOSIS — Z48813 Encounter for surgical aftercare following surgery on the respiratory system: Secondary | ICD-10-CM | POA: Diagnosis not present

## 2019-09-16 DIAGNOSIS — Z48813 Encounter for surgical aftercare following surgery on the respiratory system: Secondary | ICD-10-CM | POA: Diagnosis not present

## 2019-09-19 DIAGNOSIS — Z23 Encounter for immunization: Secondary | ICD-10-CM | POA: Diagnosis not present

## 2019-09-22 DIAGNOSIS — Z23 Encounter for immunization: Secondary | ICD-10-CM | POA: Diagnosis not present

## 2019-10-07 DIAGNOSIS — Z48813 Encounter for surgical aftercare following surgery on the respiratory system: Secondary | ICD-10-CM | POA: Diagnosis not present

## 2019-10-21 DIAGNOSIS — L732 Hidradenitis suppurativa: Secondary | ICD-10-CM | POA: Diagnosis not present

## 2019-10-21 DIAGNOSIS — R8789 Other abnormal findings in specimens from female genital organs: Secondary | ICD-10-CM | POA: Diagnosis not present

## 2019-10-21 DIAGNOSIS — R87618 Other abnormal cytological findings on specimens from cervix uteri: Secondary | ICD-10-CM | POA: Diagnosis not present

## 2019-11-10 DIAGNOSIS — B977 Papillomavirus as the cause of diseases classified elsewhere: Secondary | ICD-10-CM | POA: Diagnosis not present

## 2019-11-10 DIAGNOSIS — R87618 Other abnormal cytological findings on specimens from cervix uteri: Secondary | ICD-10-CM | POA: Diagnosis not present

## 2019-11-23 DIAGNOSIS — R42 Dizziness and giddiness: Secondary | ICD-10-CM | POA: Diagnosis not present

## 2019-11-23 DIAGNOSIS — R5383 Other fatigue: Secondary | ICD-10-CM | POA: Diagnosis not present

## 2019-12-01 DIAGNOSIS — R42 Dizziness and giddiness: Secondary | ICD-10-CM | POA: Diagnosis not present

## 2020-01-16 DIAGNOSIS — R350 Frequency of micturition: Secondary | ICD-10-CM | POA: Diagnosis not present

## 2020-01-16 DIAGNOSIS — L732 Hidradenitis suppurativa: Secondary | ICD-10-CM | POA: Diagnosis not present

## 2020-01-16 DIAGNOSIS — I10 Essential (primary) hypertension: Secondary | ICD-10-CM | POA: Diagnosis not present

## 2020-02-03 DIAGNOSIS — N898 Other specified noninflammatory disorders of vagina: Secondary | ICD-10-CM | POA: Diagnosis not present

## 2020-02-03 DIAGNOSIS — N760 Acute vaginitis: Secondary | ICD-10-CM | POA: Diagnosis not present

## 2020-02-03 DIAGNOSIS — B9689 Other specified bacterial agents as the cause of diseases classified elsewhere: Secondary | ICD-10-CM | POA: Diagnosis not present

## 2020-02-03 DIAGNOSIS — Z01419 Encounter for gynecological examination (general) (routine) without abnormal findings: Secondary | ICD-10-CM | POA: Diagnosis not present

## 2020-02-14 DIAGNOSIS — I499 Cardiac arrhythmia, unspecified: Secondary | ICD-10-CM | POA: Diagnosis not present

## 2020-02-16 DIAGNOSIS — R0789 Other chest pain: Secondary | ICD-10-CM | POA: Diagnosis not present

## 2020-02-16 DIAGNOSIS — R079 Chest pain, unspecified: Secondary | ICD-10-CM | POA: Diagnosis not present

## 2020-02-16 DIAGNOSIS — R05 Cough: Secondary | ICD-10-CM | POA: Diagnosis not present

## 2020-02-16 DIAGNOSIS — R7989 Other specified abnormal findings of blood chemistry: Secondary | ICD-10-CM | POA: Diagnosis not present

## 2020-02-16 DIAGNOSIS — R0781 Pleurodynia: Secondary | ICD-10-CM | POA: Diagnosis not present

## 2020-02-16 DIAGNOSIS — R42 Dizziness and giddiness: Secondary | ICD-10-CM | POA: Diagnosis not present

## 2020-02-20 ENCOUNTER — Other Ambulatory Visit: Payer: Self-pay | Admitting: Otolaryngology

## 2020-02-20 DIAGNOSIS — R42 Dizziness and giddiness: Secondary | ICD-10-CM

## 2020-03-08 ENCOUNTER — Ambulatory Visit
Admission: RE | Admit: 2020-03-08 | Discharge: 2020-03-08 | Disposition: A | Discharge status: Home / Self Care | Payer: Medicaid Other | Source: Ambulatory Visit | Attending: Otolaryngology | Admitting: Otolaryngology

## 2020-03-08 ENCOUNTER — Other Ambulatory Visit: Payer: Self-pay

## 2020-03-08 DIAGNOSIS — J3489 Other specified disorders of nose and nasal sinuses: Secondary | ICD-10-CM | POA: Diagnosis not present

## 2020-03-08 DIAGNOSIS — R42 Dizziness and giddiness: Secondary | ICD-10-CM

## 2020-03-08 DIAGNOSIS — J320 Chronic maxillary sinusitis: Secondary | ICD-10-CM | POA: Diagnosis not present

## 2020-03-08 DIAGNOSIS — R413 Other amnesia: Secondary | ICD-10-CM | POA: Diagnosis not present

## 2020-03-08 MED ORDER — GADOBUTROL 1 MMOL/ML IV SOLN
7.5000 mL | Freq: Once | INTRAVENOUS | Status: AC | PRN
  Administered 2020-03-08: 7.5 mL via INTRAVENOUS

## 2020-05-11 DIAGNOSIS — B309 Viral conjunctivitis, unspecified: Secondary | ICD-10-CM | POA: Diagnosis not present

## 2020-05-11 DIAGNOSIS — H6591 Unspecified nonsuppurative otitis media, right ear: Secondary | ICD-10-CM | POA: Diagnosis not present

## 2020-07-13 DIAGNOSIS — N760 Acute vaginitis: Secondary | ICD-10-CM | POA: Diagnosis not present

## 2020-07-13 DIAGNOSIS — J359 Chronic disease of tonsils and adenoids, unspecified: Secondary | ICD-10-CM | POA: Diagnosis not present

## 2020-07-13 DIAGNOSIS — B9689 Other specified bacterial agents as the cause of diseases classified elsewhere: Secondary | ICD-10-CM | POA: Diagnosis not present

## 2020-07-13 DIAGNOSIS — Z113 Encounter for screening for infections with a predominantly sexual mode of transmission: Secondary | ICD-10-CM | POA: Diagnosis not present

## 2020-09-14 DIAGNOSIS — Z7251 High risk heterosexual behavior: Secondary | ICD-10-CM | POA: Diagnosis not present

## 2020-09-14 DIAGNOSIS — Z113 Encounter for screening for infections with a predominantly sexual mode of transmission: Secondary | ICD-10-CM | POA: Diagnosis not present

## 2020-09-14 DIAGNOSIS — N898 Other specified noninflammatory disorders of vagina: Secondary | ICD-10-CM | POA: Diagnosis not present

## 2020-09-14 DIAGNOSIS — R350 Frequency of micturition: Secondary | ICD-10-CM | POA: Diagnosis not present

## 2021-01-27 IMAGING — MR MR BRAIN/IAC WO/W CM
10 of 14 series · 26 of 48 positions shown · IV contrast (gadavist)
Comparison: None.

CLINICAL DATA: 39-year-old female with intermittent dizziness for 1
year. Occasional confusion and memory loss.

EXAM:
MRI HEAD WITHOUT AND WITH CONTRAST
TECHNIQUE: Multiplanar, multiecho pulse sequences of the brain and surrounding
structures were obtained without and with intravenous contrast.
CONTRAST:  7.5mL GADAVIST GADOBUTROL 1 MMOL/ML IV SOLN

[Series 5: T1 · sagittal · 5.0mm · 0.62mm/px · 1 of 21 slices shown (1 of 3)]
[im 1/21]
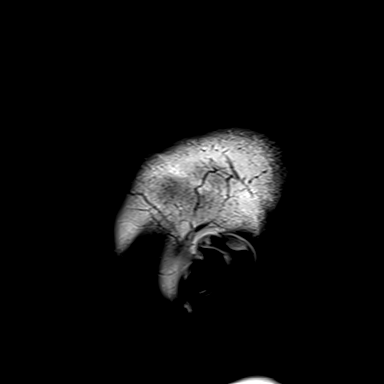

[Series 6: ax dwi_tracew · axial · 3.0mm · 0.60mm/px · z∈[-130,+24]mm · 4 of 48 slices shown]
[im 1/48]
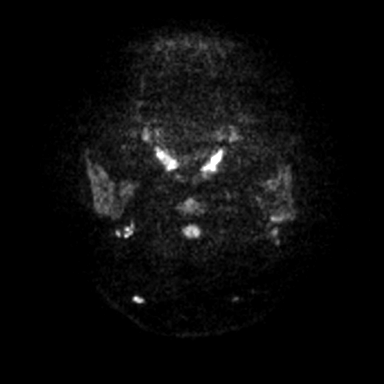
[im 16/48]
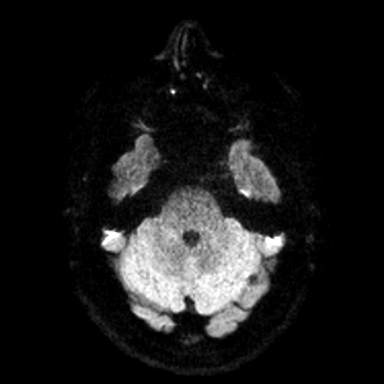
[im 32/48]
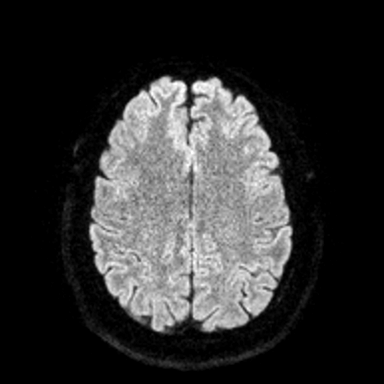
[im 48/48]
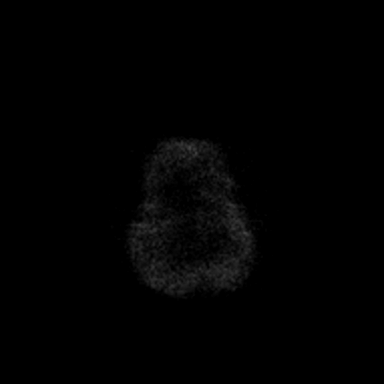

[Series 7: ax dwi_adc · axial · 3.0mm · 0.60mm/px · z∈[-130,-81]mm · 2 of 48 slices shown]
[im 1/48]
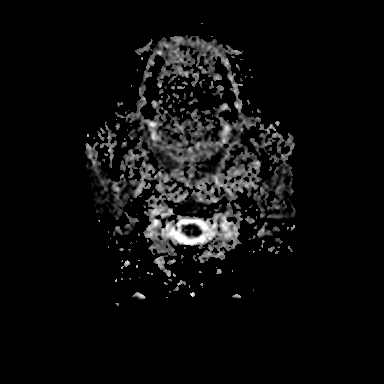
[im 16/48]
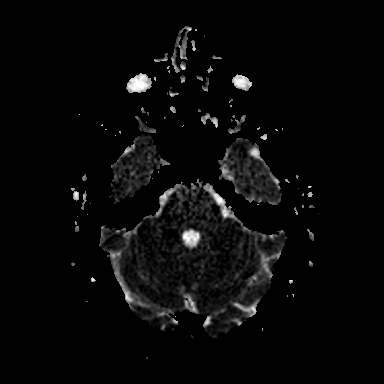

[Series 8: T2 · axial · 5.0mm · 0.53mm/px · z∈[-124,+19]mm · 2 of 25 slices shown]
[im 1/25]
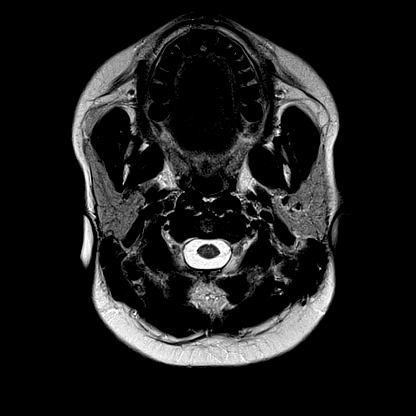
[im 25/25]
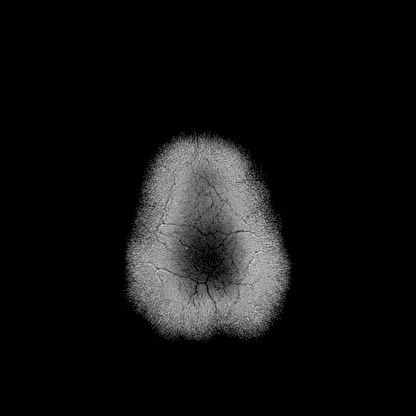

[Series 13: T1 · coronal · non-contrast · 3.0mm · 0.21mm/px · 1 of 11 slices shown (2 of 3)]
[im 1/11]
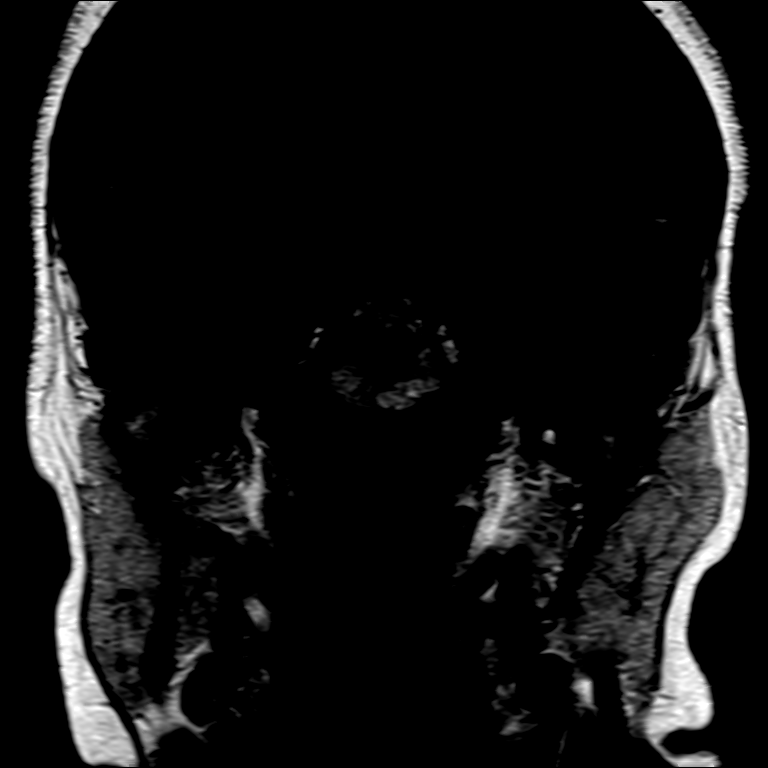

[Series 14: FLAIR · axial · 3.0mm · 0.53mm/px · z∈[-133,+28]mm · 4 of 55 slices shown]
[im 1/55]
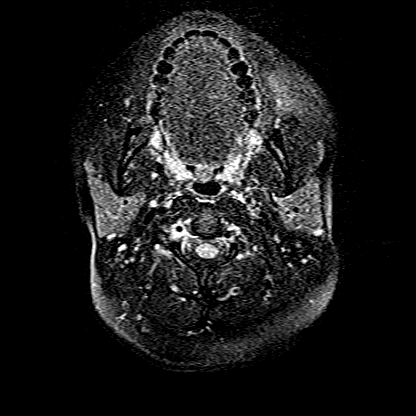
[im 19/55]
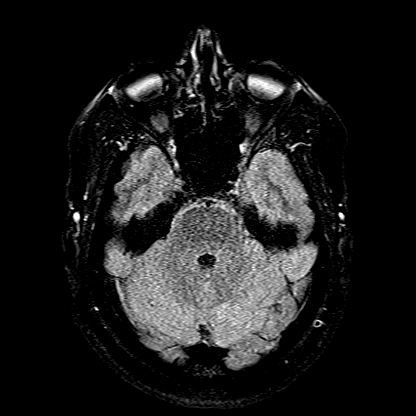
[im 37/55]
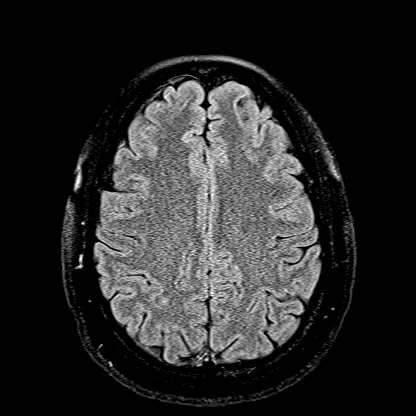
[im 55/55]
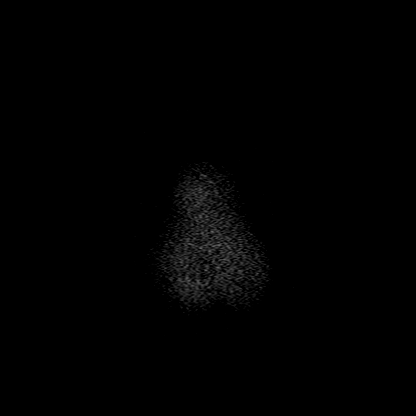

[Series 16: T1 · axial · non-contrast · 3.0mm · 0.21mm/px · 1 of 11 slices shown (3 of 3)]
[im 1/11]
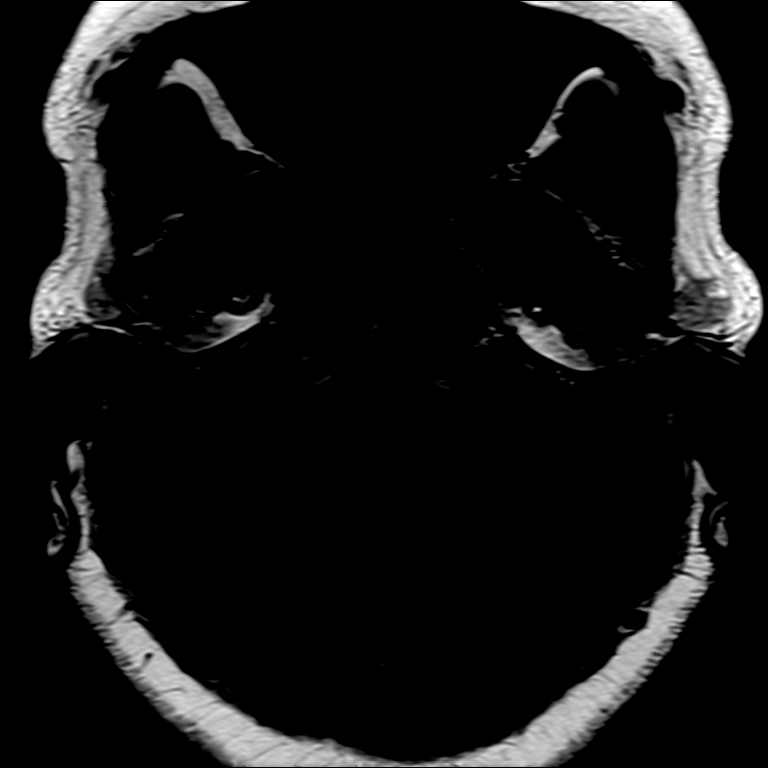

[Series 17: T1 post-contrast · axial · 3.0mm · 0.21mm/px · 1 of 11 slices shown (1 of 3)]
[im 1/11]
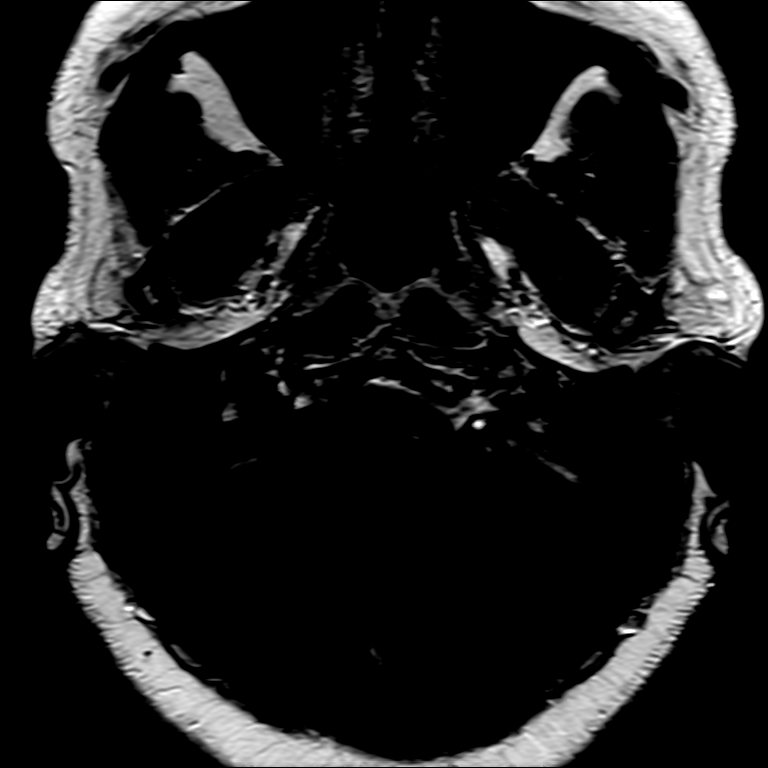

[Series 18: T1 post-contrast · coronal · 3.0mm · 0.21mm/px · 1 of 11 slices shown (2 of 3)]
[im 1/11]
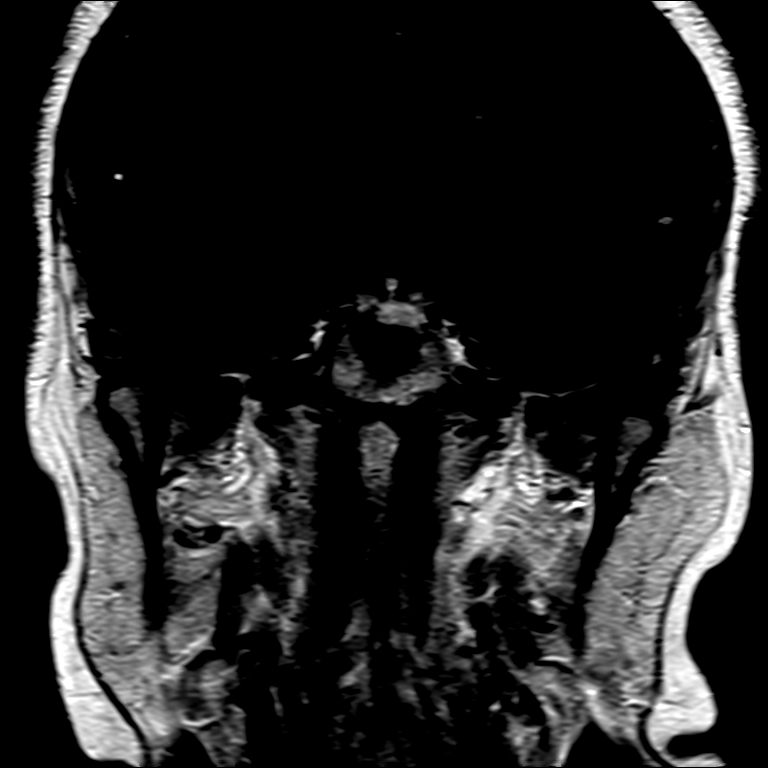

[Series 19: T1 post-contrast · axial · 1.0mm · 0.98mm/px · z∈[-141,+32]mm · 9 of 176 slices shown (3 of 3)]
[im 1/176]
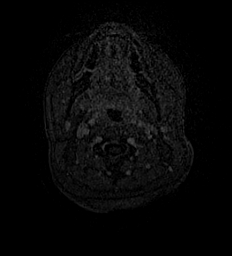
[im 30/176]
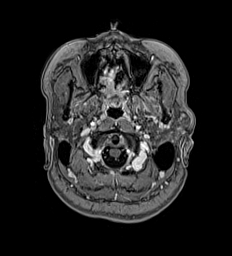
[im 59/176]
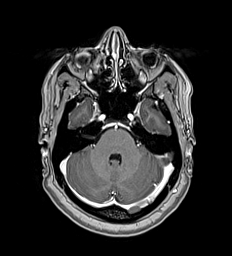
[im 73/176]
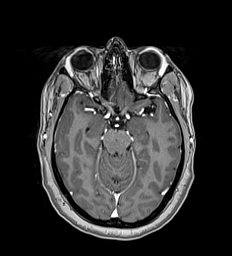
[im 88/176]
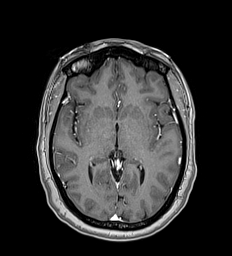
[im 103/176]
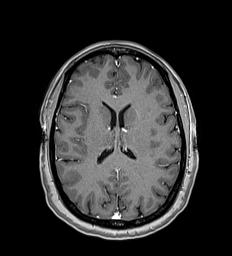
[im 117/176]
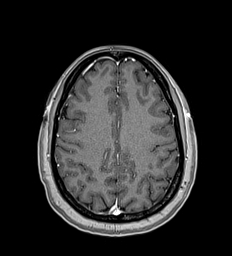
[im 146/176]
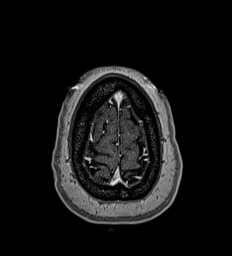
[im 176/176]
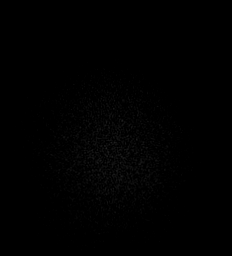

[26 of 48 positions shown; findings below may reference images not displayed]

FINDINGS: Brain: Normal cerebral volume. No restricted diffusion to suggest
acute infarction. No midline shift, mass effect, evidence of mass
lesion, ventriculomegaly, extra-axial collection or acute
intracranial hemorrhage. Cervicomedullary junction and pituitary are
within normal limits.

Gray and white matter signal is within normal limits throughout the
brain. No encephalomalacia or chronic cerebral blood products
identified. No abnormal enhancement identified. No dural thickening
is evident.

Vascular: Major intracranial vascular flow voids are preserved.

Skull and upper cervical spine: Negative. Visualized bone marrow
signal is within normal limits.

Sinuses/Orbits: Negative orbits. Paranasal sinuses are well
pneumatized, with minimal mucosal thickening in the left maxillary
alveolar recess.

Visible face and scalp soft tissues appear negative.

Other: Dedicated internal auditory imaging. Normal cerebellopontine
angles. Normal bilateral cisternal and intracanalicular 7th and 8th
cranial nerve segments. Symmetric T2 signal in the bilateral cochlea
and vestibular structures. Mastoids are clear. Normal stylomastoid
foramina.

No abnormal enhancement identified. No skull base abnormality
identified.
IMPRESSION: 1. Normal internal auditory imaging.
2. Normal MRI appearance of the brain.

## 2021-01-29 DIAGNOSIS — N760 Acute vaginitis: Secondary | ICD-10-CM | POA: Diagnosis not present

## 2021-01-29 DIAGNOSIS — B9689 Other specified bacterial agents as the cause of diseases classified elsewhere: Secondary | ICD-10-CM | POA: Diagnosis not present

## 2021-01-29 DIAGNOSIS — R197 Diarrhea, unspecified: Secondary | ICD-10-CM | POA: Diagnosis not present

## 2021-01-29 DIAGNOSIS — R1084 Generalized abdominal pain: Secondary | ICD-10-CM | POA: Diagnosis not present

## 2021-04-09 DIAGNOSIS — Z113 Encounter for screening for infections with a predominantly sexual mode of transmission: Secondary | ICD-10-CM | POA: Diagnosis not present

## 2021-04-09 DIAGNOSIS — R7303 Prediabetes: Secondary | ICD-10-CM | POA: Diagnosis not present

## 2021-04-09 DIAGNOSIS — Z124 Encounter for screening for malignant neoplasm of cervix: Secondary | ICD-10-CM | POA: Diagnosis not present

## 2021-04-09 DIAGNOSIS — Z0000 Encounter for general adult medical examination without abnormal findings: Secondary | ICD-10-CM | POA: Diagnosis not present

## 2021-09-10 DIAGNOSIS — R202 Paresthesia of skin: Secondary | ICD-10-CM | POA: Diagnosis not present

## 2021-09-10 DIAGNOSIS — Z3200 Encounter for pregnancy test, result unknown: Secondary | ICD-10-CM | POA: Diagnosis not present

## 2021-09-10 DIAGNOSIS — O0991 Supervision of high risk pregnancy, unspecified, first trimester: Secondary | ICD-10-CM | POA: Diagnosis not present

## 2021-09-10 DIAGNOSIS — R200 Anesthesia of skin: Secondary | ICD-10-CM | POA: Diagnosis not present

## 2022-11-24 DIAGNOSIS — K760 Fatty (change of) liver, not elsewhere classified: Secondary | ICD-10-CM | POA: Diagnosis not present

## 2022-11-24 DIAGNOSIS — R7303 Prediabetes: Secondary | ICD-10-CM | POA: Diagnosis not present

## 2022-11-24 DIAGNOSIS — E6609 Other obesity due to excess calories: Secondary | ICD-10-CM | POA: Diagnosis not present

## 2022-11-24 DIAGNOSIS — Z6834 Body mass index (BMI) 34.0-34.9, adult: Secondary | ICD-10-CM | POA: Diagnosis not present

## 2023-06-22 DIAGNOSIS — R7303 Prediabetes: Secondary | ICD-10-CM | POA: Diagnosis not present

## 2023-06-22 DIAGNOSIS — E669 Obesity, unspecified: Secondary | ICD-10-CM | POA: Diagnosis not present

## 2023-06-22 DIAGNOSIS — Z6834 Body mass index (BMI) 34.0-34.9, adult: Secondary | ICD-10-CM | POA: Diagnosis not present

## 2023-08-16 DIAGNOSIS — H1033 Unspecified acute conjunctivitis, bilateral: Secondary | ICD-10-CM | POA: Diagnosis not present
